# Patient Record
Sex: Male | Born: 1965 | Race: Black or African American | Hispanic: No | Marital: Single | Smoking: Never smoker
Health system: Southern US, Community
[De-identification: ages and names within clinical notes are randomized; demographics above are authoritative.]

## PROBLEM LIST (undated history)

## (undated) DIAGNOSIS — I10 Essential (primary) hypertension: Secondary | ICD-10-CM

## (undated) DIAGNOSIS — N189 Chronic kidney disease, unspecified: Secondary | ICD-10-CM

## (undated) HISTORY — DX: Chronic kidney disease, unspecified: N18.9

## (undated) HISTORY — DX: Essential (primary) hypertension: I10

---

## 2015-05-31 ENCOUNTER — Ambulatory Visit (INDEPENDENT_AMBULATORY_CARE_PROVIDER_SITE_OTHER): Payer: BC Managed Care – PPO | Admitting: Emergency Medicine

## 2015-05-31 VITALS — BP 128/80 | HR 83 | Temp 98.0°F | Resp 16 | Ht 67.0 in | Wt 247.0 lb

## 2015-05-31 DIAGNOSIS — IMO0002 Reserved for concepts with insufficient information to code with codable children: Secondary | ICD-10-CM

## 2015-05-31 DIAGNOSIS — T148 Other injury of unspecified body region: Secondary | ICD-10-CM

## 2015-05-31 MED ORDER — AMOXICILLIN-POT CLAVULANATE 875-125 MG PO TABS: 1.0000 | ORAL_TABLET | Freq: Two times a day (BID) | ORAL | Status: DC

## 2015-05-31 NOTE — Progress Notes (Signed)
Subjective:  Patient ID: Jacob Bis., male    DOB: [birth year]  Age: 49 y.o. MRN: [id number]  CC: Animal Bite   HPI Jacob Duke. presents  with a dog bite on his left mid forearm. He was walking his dog last night and another dog attacked him. The other dog is not current on rabies and is been confined by animal control. He is current on tetanus. He has some erythema and tenderness of the wound with some swelling. There is no drainage. There is no lymphangitis  History Jacob Duke has a past medical history of Hypertension.   He has no past surgical history on file.   His  family history is not on file.  He   reports that he has never smoked. He does not have any smokeless tobacco history on file. He reports that he does not drink alcohol. His drug history is not on file.  No outpatient prescriptions prior to visit.   No facility-administered medications prior to visit.    Social History   Social History  . Marital Status: Single    Spouse Name: N/A  . Number of Children: N/A  . Years of Education: N/A   Social History Main Topics  . Smoking status: Never Smoker   . Smokeless tobacco: None  . Alcohol Use: No  . Drug Use: None  . Sexual Activity: Not Asked   Other Topics Concern  . None   Social History Narrative  . None     Review of Systems  Constitutional: Negative for fever, chills and appetite change.  HENT: Negative for congestion, ear pain, postnasal drip, sinus pressure and sore throat.   Eyes: Negative for pain and redness.  Respiratory: Negative for cough, shortness of breath and wheezing.   Cardiovascular: Negative for leg swelling.  Gastrointestinal: Negative for nausea, vomiting, abdominal pain, diarrhea, constipation and blood in stool.  Endocrine: Negative for polyuria.  Genitourinary: Negative for dysuria, urgency, frequency and flank pain.  Musculoskeletal: Negative for gait problem.  Skin: Positive for wound. Negative for rash.    Neurological: Negative for weakness and headaches.  Psychiatric/Behavioral: Negative for confusion and decreased concentration. The patient is not nervous/anxious.     Objective:  BP 128/80 mmHg  Pulse 83  Temp(Src) 98 F (36.7 C) (Oral)  Resp 16  Ht  (1.702 m)  Wt 247 lb (112.038 kg)  BMI 38.68 kg/m2  SpO2 95%  Physical Exam  Constitutional: He is oriented to person, place, and time. He appears well-developed and well-nourished. No distress.  HENT:  Head: Normocephalic and atraumatic.  Right Ear: External ear normal.  Left Ear: External ear normal.  Nose: Nose normal.  Eyes: Conjunctivae and EOM are normal. Pupils are equal, round, and reactive to light. No scleral icterus.  Neck: Normal range of motion. Neck supple. No tracheal deviation present.  Cardiovascular: Normal rate, regular rhythm and normal heart sounds.   Pulmonary/Chest: Effort normal. No respiratory distress. He has no wheezes. He has no rales.  Abdominal: He exhibits no mass. There is no tenderness. There is no rebound and no guarding.  Musculoskeletal: He exhibits no edema.  Lymphadenopathy:    He has no cervical adenopathy.  Neurological: He is alert and oriented to person, place, and time. Coordination normal.  Skin: Skin is warm and dry. No rash noted.  Psychiatric: He has a normal mood and affect. His behavior is normal.   he has a superficial laceration of the left forearm with some cellulitis. There  is no lymphangitis or drainage. Wound looks clean    Assessment & Plan:   Jacob Duke was seen today for animal bite.  Diagnoses and all orders for this visit:  Laceration  Other orders -     Discontinue: amoxicillin-clavulanate (AUGMENTIN) 875-125 MG per tablet; Take 1 tablet by mouth 2 (two) times daily. -     amoxicillin-clavulanate (AUGMENTIN) 875-125 MG per tablet; Take 1 tablet by mouth 2 (two) times daily.   I am having Jacob Duke maintain his metoprolol succinate, metoprolol succinate,  Atorvastatin Calcium (LIPITOR PO), amLODipine, enalapril, and amoxicillin-clavulanate.  Meds ordered this encounter  Medications  . metoprolol succinate (TOPROL-XL) 50 MG 24 hr tablet    Sig: Take 50 mg by mouth daily. Take with or immediately following a meal.  . metoprolol succinate (TOPROL-XL) 100 MG 24 hr tablet    Sig: Take 100 mg by mouth daily. Take with or immediately following a meal.  . Atorvastatin Calcium (LIPITOR PO)    Sig: Take by mouth.  Marland Kitchen amLODipine (NORVASC) 10 MG tablet    Sig: Take 10 mg by mouth daily.  . enalapril (VASOTEC) 20 MG tablet    Sig: Take 20 mg by mouth daily.  Marland Kitchen DISCONTD: amoxicillin-clavulanate (AUGMENTIN) 875-125 MG per tablet    Sig: Take 1 tablet by mouth 2 (two) times daily.    Dispense:  20 tablet    Refill:  0  . amoxicillin-clavulanate (AUGMENTIN) 875-125 MG per tablet    Sig: Take 1 tablet by mouth 2 (two) times daily.    Dispense:  20 tablet    Refill:  0   We discussed the concerns about rabies in the appropriate management of the dog bite and rabies emphasis  Appropriate red flag conditions were discussed with the patient as well as actions that should be taken.  Patient expressed his understanding.  Follow-up: Return if symptoms worsen or fail to improve.  Carmelina Dane, MD

## 2015-05-31 NOTE — Patient Instructions (Signed)

## 2016-02-26 ENCOUNTER — Ambulatory Visit (INDEPENDENT_AMBULATORY_CARE_PROVIDER_SITE_OTHER): Payer: BC Managed Care – PPO | Admitting: Family Medicine

## 2016-02-26 VITALS — BP 128/84 | HR 89 | Temp 98.1°F | Resp 18 | Ht 67.0 in | Wt 270.0 lb

## 2016-02-26 DIAGNOSIS — H01006 Unspecified blepharitis left eye, unspecified eyelid: Secondary | ICD-10-CM | POA: Diagnosis not present

## 2016-02-26 DIAGNOSIS — H5310 Unspecified subjective visual disturbances: Secondary | ICD-10-CM

## 2016-02-26 DIAGNOSIS — H11422 Conjunctival edema, left eye: Secondary | ICD-10-CM | POA: Diagnosis not present

## 2016-02-26 DIAGNOSIS — H5789 Other specified disorders of eye and adnexa: Secondary | ICD-10-CM

## 2016-02-26 DIAGNOSIS — H578 Other specified disorders of eye and adnexa: Secondary | ICD-10-CM

## 2016-02-26 MED ORDER — HYPROMELLOSE (GONIOSCOPIC) 2.5 % OP SOLN: 2.0000 [drp] | Freq: Four times a day (QID) | OPHTHALMIC | Status: DC

## 2016-02-26 NOTE — Patient Instructions (Addendum)
Appoint[facility] with [facility] [place] tomorrow 02/27/16 @ 1:30pm. Please bring current insurance card with you to appointment. We will fax over your note from today's visit and insurance card we have on file.     IF you received an x-ray today, you will receive an invoice from [facility]. Please contact [facility] at [phone] with questions or concerns regarding your invoice.   IF you received labwork today, you will receive an invoice from [facility]. Please contact [facility] at [phone] with questions or concerns regarding your invoice.   Our billing staff will not be able to assist you with questions regarding bills from these companies.  You will be contacted with the lab results as soon as they are available. The fastest way to get your results is to activate your My Chart account. Instructions are located on the last page of this paperwork. If you have not heard from us regarding the results in 2 weeks, please contact this office.     Scleritis and Episcleritis The outer part of the eyeball is covered with a tough fibrous covering called the sclera. It is the white part of the eye. This tough covering also has a thin membrane lying on top of it called the episclera.   When the sclera becomes red and sore (inflamed), it is called scleritis.  When the episclera becomes inflamed, it is called episcleritis. CAUSES   Scleritis is usually more severe and is associated with autoimmune diseases such as:  Rheumatoid arthritis.  Inflammations of the bowel such as Crohn's Disease (regional enteritis).  Ulcerative colitis.  Episcleritis usually has no known cause. SYMPTOMS  Both scleritis and episcleritis cause red patches or a nodule on the eye. DIAGNOSIS  This condition should be examined by an ophthalmologist. This is because very strong medications that have side effects to the body and eye may be required to treat severe  attacks. Further investigations into the patient's general health may be necessary. TREATMENT   Episcleritis tends to get better without treatment within a week or two.  Scleritis is more severe. Often, your caregiver will prescribe steroids by mouth (orally) or as drops in the eye. This treatment helps lessen the redness and soreness (inflammation). HOME CARE INSTRUCTIONS   Take all medications as directed.  Keep your follow-up appointments as directed.  Avoid irritation of the involved eye(s).  Stop using hard or soft contact lenses until your caregiver tells you that it is safe to use them. SEEK MEDICAL CARE IF:   Redness or irritation gets worse.  You develop pain or sensitivity to light.  You develop any change in vision in the involved eye(s).   This information is not intended to replace advice given to you by your health care provider. Make sure you discuss any questions you have with your health care provider.   Document Released: 10/01/2001 Document Revised: 12/30/2011 Document Reviewed: 02/02/2009 Elsevier Interactive Patient Education [employer]2016 [employer]vier Inc.

## 2016-02-26 NOTE — Progress Notes (Signed)
Subjective:  By signing my name below, I, Stann Oresung-Kai Tsai, attest that this documentation has been prepared under the direction and in the presence of Norberto SorensonEva Shanard Treto, MD. Electronically Signed: Stann Oresung-Kai Tsai, Scribe. 02/26/2016 , 4:26 PM .  Patient was seen in Room 11 .   Patient ID: Jacob BisMorris Kahl Jr., male    DOB: 12-Feb-1966, 50 y.o.   MRN: [id number] Chief Complaint  Patient presents with  . Conjunctivitis    x 2 days/ left eye   HPI Jacob BisMorris Okuda Jr. is a 50 y.o. male who [facility] complaining of pink eye in his left eye, with watery discharge, intermittent itching and some blurry vision, that was first noticed 5 days ago. He also has slight photophobia early in the morning. He was training 5 days ago at work and noticed something possibly into his eye. He started rubbing his left eye and noticed it becoming red. He went to [facility] for a quick check up and received tobramycin eye drop. After starting the eye drops, he informs his eyes became redder. His last eye drop usage was at 2:00PM today. He denies any eye pain, fever or chills. He wears corrective lenses and denies wearing corrective contacts.   [facility] [place].  He denies chronic eye problems. He has h/o HTN.   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   Past Medical History  Diagnosis Date  . Hypertension    Prior to Admission medications   Medication Sig Start Date End Date Taking? Authorizing Provider  Atorvastatin Calcium (LIPITOR PO) Take by mouth.   Yes Historical Provider, MD  enalapril (VASOTEC) 20 MG tablet Take 20 mg by mouth daily.   Yes Historical Provider, MD  metoprolol succinate (TOPROL-XL) 100 MG 24 hr tablet Take 100 mg by mouth daily. Take with or immediately following a meal.   Yes Historical Provider, MD  metoprolol succinate (TOPROL-XL) 50 MG 24 hr tablet Take 50 mg by mouth daily. Take with or immediately following a meal.   Yes Historical Provider,  MD  amLODipine (NORVASC) 10 MG tablet Take 10 mg by mouth daily. Reported on 02/26/2016    Historical Provider, MD  amoxicillin-clavulanate (AUGMENTIN) 875-125 MG per tablet Take 1 tablet by mouth 2 (two) times daily. Patient not taking: Reported on 02/26/2016 05/31/15   Carmelina DaneJeffery S Anderson, MD   Allergies  Allergen Reactions  . Cozaar [Losartan Potassium] Hives  . Bactrim [Sulfamethoxazole-Trimethoprim] Rash    Review of Systems  Constitutional: Negative for fever, chills, activity change and fatigue.  HENT: Positive for facial swelling. Negative for congestion, rhinorrhea, sinus pressure and sore throat.   Eyes: Positive for photophobia, discharge, redness, itching and visual disturbance. Negative for pain.  Gastrointestinal: Negative for nausea, vomiting, diarrhea and constipation.  Neurological: Positive for facial asymmetry. Negative for dizziness and headaches.  Hematological: Negative for adenopathy.       Objective:   Physical Exam  Constitutional: He is oriented to person, place, and time. He appears well-developed and well-nourished. No distress.  HENT:  Head: Normocephalic and atraumatic.  Eyes: EOM are normal. Pupils are equal, round, and reactive to light.  Severe chemosis over all conjunctiva of left eye more medial than lateral injection; inflammation and swelling of the conjunctiva and inner lower lid, lower more so than upper lid Fundoscopic exam equal bilaterally, silver lined vessels, diffuse uptake, no ulcers or abrasions  Neck: Neck supple.  Cardiovascular: Normal rate.   Pulmonary/Chest: Effort normal.  No respiratory distress.  Musculoskeletal: Normal range of motion.  Neurological: He is alert and oriented to person, place, and time.  Skin: Skin is warm and dry.  Psychiatric: He has a normal mood and affect. His behavior is normal.  Nursing note and vitals reviewed.  BP 128/84 mmHg  Pulse 89  Temp(Src) 98.1 F (36.7 C) (Oral)  Resp 18  Ht  (1.702 m)   Wt 270 lb (122.471 kg)  BMI 42.28 kg/m2  SpO2 94%    Visual Acuity Screening   Right eye Left eye Both eyes  Without correction:     With correction:      Assessment & Plan:   1. Redness of eye, left   2. Blepharitis of left eye   3. Chemosis of conjunctiva, left   4. Subjective vision disturbance, left   Stat referral to opto - appt tomorrow obtained while pt was in office. Stop tobramycin gtts - could be allergic (had never been on prior ). Use artirficial tears o/n. Concern for scleritis but could be episcleritis.  Can't r/o viral conjunctivitis or keratitis.   Meds ordered this encounter  Medications  . hydroxypropyl methylcellulose / hypromellose (ISOPTO TEARS / GONIOVISC) 2.5 % ophthalmic solution    Sig: Place 2 drops into the left eye 4 (four) times daily.    Dispense:  15 mL    Refill:  0    I personally performed the services described in this documentation, which was scribed in my presence. The recorded information has been reviewed and considered, and addended by me as needed.  Norberto Sorenson, MD MPH

## 2016-02-27 NOTE — Progress Notes (Signed)
Waiting on call back with fax number - Unable to locate it on the website.  Will fax as soon as I have the number.

## 2016-08-02 ENCOUNTER — Ambulatory Visit (INDEPENDENT_AMBULATORY_CARE_PROVIDER_SITE_OTHER): Payer: BC Managed Care – PPO

## 2016-08-02 ENCOUNTER — Ambulatory Visit (INDEPENDENT_AMBULATORY_CARE_PROVIDER_SITE_OTHER): Payer: BC Managed Care – PPO | Admitting: Family Medicine

## 2016-08-02 VITALS — BP 132/100 | HR 80 | Temp 97.7°F | Resp 16 | Ht 67.0 in | Wt 271.2 lb

## 2016-08-02 DIAGNOSIS — S52691A Other fracture of lower end of right ulna, initial encounter for closed fracture: Secondary | ICD-10-CM | POA: Diagnosis not present

## 2016-08-02 DIAGNOSIS — S6991XA Unspecified injury of right wrist, hand and finger(s), initial encounter: Secondary | ICD-10-CM | POA: Diagnosis not present

## 2016-08-02 DIAGNOSIS — I10 Essential (primary) hypertension: Secondary | ICD-10-CM

## 2016-08-02 MED ORDER — IBUPROFEN 800 MG PO TABS: 800.0000 mg | ORAL_TABLET | Freq: Three times a day (TID) | ORAL | 0 refills | Status: DC | PRN

## 2016-08-02 MED ORDER — HYDROCODONE-ACETAMINOPHEN 10-325 MG PO TABS: 1.0000 | ORAL_TABLET | Freq: Three times a day (TID) | ORAL | 0 refills | Status: AC | PRN

## 2016-08-02 MED ORDER — IBUPROFEN 800 MG PO TABS: 800.0000 mg | ORAL_TABLET | Freq: Three times a day (TID) | ORAL | 0 refills | Status: AC | PRN

## 2016-08-02 NOTE — Patient Instructions (Addendum)
IF you received an x-ray today, you will receive an invoice from [facility]. Please contact [facility] at [phone] with questions or concerns regarding your invoice.   IF you received labwork today, you will receive an invoice from [employer]. Please contact [facility] at [phone] with questions or concerns regarding your invoice.   Our billing staff will not be able to assist you with questions regarding bills from these companies.  You will be contacted with the lab results as soon as they are available. The fastest way to get your results is to activate your My Chart account. Instructions are located on the last page of this paperwork. If you have not heard from [place] regarding the results in 2 weeks, please contact this office.      Forearm Fracture A forearm fracture is a break in one or both of the bones of your arm that are between the elbow and the wrist. Your forearm is made up of two bones:  Radius. This is the bone on the inside of your arm near your thumb.  Ulna. This is the bone on the outside of your arm near your little finger. Middle forearm fractures usually break both the radius and the ulna. Most forearm fractures that involve both the ulna and radius will require surgery. CAUSES Common causes of this type of fracture include:  Falling on an outstretched arm.  Accidents, such as a car or bike accident.  A hard, direct hit to the middle part of your arm. RISK FACTORS You may be at higher risk for this type of fracture if:  You play contact sports.  You have a condition that causes your bones to be weak or thin (osteoporosis). SIGNS AND SYMPTOMS A forearm fracture causes pain immediately after the injury. Other signs and symptoms include:  An abnormal bend or bump in your arm (deformity).  Swelling.  Numbness or tingling.  Tenderness.  Inability to turn your hand from side to side  (rotate).  Bruising. DIAGNOSIS Your health care provider may diagnose a forearm fracture based on:  Your symptoms.  Your medical history, including any recent injury.  A physical exam. Your health care provider will look for any deformity and feel for tenderness over the break. Your health care provider will also check whether the bones are out of place.  An X-ray exam to confirm the diagnosis and learn more about the type of fracture. TREATMENT The goals of treatment are to get the bone or bones in proper position for healing and to keep the bones from moving so they will heal over time. Your treatment will depend on many factors, especially the type of fracture that you have.  If the fractured bone or bones:  Are in the correct position (nondisplaced), you may only need to wear a cast or a splint.  Have a slightly displaced fracture, you may need to have the bones moved back into place manually (closed reduction) before the splint or cast is put on.  You may have a temporary splint before you have a cast. The splint allows room for some swelling. After a few days, a cast can replace the splint.  You may have to wear the cast for 6-8 weeks or as directed by your health care provider.  The cast may be changed after about 3 weeks or as directed by your health care provider.  After your cast is removed, you may need physical therapy to regain full movement in  your wrist or elbow.  You may need emergency surgery if you have:  A fractured bone or bones that are out of position (displaced).  A fracture with multiple fragments (comminuted fracture).  A fracture that breaks the skin (open fracture). This type of fracture may require surgical wires, plates, or screws to hold the bone or bones in place.  You may have X-rays every couple of weeks to check on your healing. HOME CARE INSTRUCTIONS If You Have a Cast:  Do not stick anything inside the cast to scratch your skin. Doing that  increases your risk of infection.  Check the skin around the cast every day. Report any concerns to your health care provider. You may put lotion on dry skin around the edges of the cast. Do not apply lotion to the skin underneath the cast. If You Have a Splint:  Wear it as directed by your health care provider. Remove it only as directed by your health care provider.  Loosen the splint if your fingers become numb and tingle, or if they turn cold and blue. Bathing  Cover the cast or splint with a watertight plastic bag to protect it from water while you bathe or shower. Do not let the cast or splint get wet. Managing Pain, Stiffness, and Swelling  If directed, apply ice to the injured area:  Put ice in a plastic bag.  Place a towel between your skin and the bag.  Leave the ice on for 20 minutes, 2-3 times a day.  Move your fingers often to avoid stiffness and to lessen swelling.  Raise the injured area above the level of your heart while you are sitting or lying down. Driving  Do not drive or operate heavy machinery while taking pain medicine.  Do not drive while wearing a cast or splint on a hand that you use for driving. Activity  Return to your normal activities as directed by your health care provider. Ask your health care provider what activities are safe for you.  Perform range-of-motion exercises only as directed by your health care provider. Safety  Do not use your injured limb to support your body weight until your health care provider says that you can. General Instructions  Do not put pressure on any part of the cast or splint until it is fully hardened. This may take several hours.  Keep the cast or splint clean and dry.  Do not use any tobacco products, including cigarettes, chewing tobacco, or electronic cigarettes. Tobacco can delay bone healing. If you need help quitting, ask your health care provider.  Take medicines only as directed by your health care  provider.  Keep all follow-up visits as directed by your health care provider. This is important. SEEK MEDICAL CARE IF:  Your pain medicine is not helping.  Your cast or splint becomes wet or damaged or suddenly feels too tight.  Your cast becomes loose.  You have more severe pain or swelling than you did before the cast.  You have severe pain when you stretch your fingers.  You continue to have pain or stiffness in your elbow or your wrist after your cast is removed. SEEK IMMEDIATE MEDICAL CARE IF:  You cannot move your fingers.  You lose feeling in your fingers or your hand.  Your hand or your fingers turn cold and pale or blue.  You notice a bad smell coming from your cast.  You have drainage from underneath your cast.  You have new stains from  blood or drainage that is coming through your cast.   This information is not intended to replace advice given to you by your health care provider. Make sure you discuss any questions you have with your health care provider.   Document Released: 10/04/2000 Document Revised: 10/28/2014 Document Reviewed: 05/23/2014 Elsevier Interactive Patient Education 2016 [employer]  Forearm Fracture A forearm fracture is a break in one or both of the bones of your arm that are between the elbow and the wrist. Your forearm is made up of two bones:  Radius. This is the bone on the inside of your arm near your thumb.  Ulna. This is the bone on the outside of your arm near your little finger. Middle forearm fractures usually break both the radius and the ulna. Most forearm fractures that involve both the ulna and radius will require surgery. CAUSES Common causes of this type of fracture include:  Falling on an outstretched arm.  Accidents, such as a car or bike accident.  A hard, direct hit to the middle part of your arm. RISK FACTORS You may be at higher risk for this type of fracture if:  You play contact sports.  You have a  condition that causes your bones to be weak or thin (osteoporosis). SIGNS AND SYMPTOMS A forearm fracture causes pain immediately after the injury. Other signs and symptoms include:  An abnormal bend or bump in your arm (deformity).  Swelling.  Numbness or tingling.  Tenderness.  Inability to turn your hand from side to side (rotate).  Bruising. DIAGNOSIS Your health care provider may diagnose a forearm fracture based on:  Your symptoms.  Your medical history, including any recent injury.  A physical exam. Your health care provider will look for any deformity and feel for tenderness over the break. Your health care provider will also check whether the bones are out of place.  An X-ray exam to confirm the diagnosis and learn more about the type of fracture. TREATMENT The goals of treatment are to get the bone or bones in proper position for healing and to keep the bones from moving so they will heal over time. Your treatment will depend on many factors, especially the type of fracture that you have.  If the fractured bone or bones:  Are in the correct position (nondisplaced), you may only need to wear a cast or a splint.  Have a slightly displaced fracture, you may need to have the bones moved back into place manually (closed reduction) before the splint or cast is put on.  You may have a temporary splint before you have a cast. The splint allows room for some swelling. After a few days, a cast can replace the splint.  You may have to wear the cast for 6-8 weeks or as directed by your health care provider.  The cast may be changed after about 3 weeks or as directed by your health care provider.  After your cast is removed, you may need physical therapy to regain full movement in your wrist or elbow.  You may need emergency surgery if you have:  A fractured bone or bones that are out of position (displaced).  A fracture with multiple fragments (comminuted fracture).  A  fracture that breaks the skin (open fracture). This type of fracture may require surgical wires, plates, or screws to hold the bone or bones in place.  You may have X-rays every couple of weeks to check on your healing. HOME CARE INSTRUCTIONS If  You Have a Cast:  Do not stick anything inside the cast to scratch your skin. Doing that increases your risk of infection.  Check the skin around the cast every day. Report any concerns to your health care provider. You may put lotion on dry skin around the edges of the cast. Do not apply lotion to the skin underneath the cast. If You Have a Splint:  Wear it as directed by your health care provider. Remove it only as directed by your health care provider.  Loosen the splint if your fingers become numb and tingle, or if they turn cold and blue. Bathing  Cover the cast or splint with a watertight plastic bag to protect it from water while you bathe or shower. Do not let the cast or splint get wet. Managing Pain, Stiffness, and Swelling  If directed, apply ice to the injured area:  Put ice in a plastic bag.  Place a towel between your skin and the bag.  Leave the ice on for 20 minutes, 2-3 times a day.  Move your fingers often to avoid stiffness and to lessen swelling.  Raise the injured area above the level of your heart while you are sitting or lying down. Driving  Do not drive or operate heavy machinery while taking pain medicine.  Do not drive while wearing a cast or splint on a hand that you use for driving. Activity  Return to your normal activities as directed by your health care provider. Ask your health care provider what activities are safe for you.  Perform range-of-motion exercises only as directed by your health care provider. Safety  Do not use your injured limb to support your body weight until your health care provider says that you can. General Instructions  Do not put pressure on any part of the cast or splint until  it is fully hardened. This may take several hours.  Keep the cast or splint clean and dry.  Do not use any tobacco products, including cigarettes, chewing tobacco, or electronic cigarettes. Tobacco can delay bone healing. If you need help quitting, ask your health care provider.  Take medicines only as directed by your health care provider.  Keep all follow-up visits as directed by your health care provider. This is important. SEEK MEDICAL CARE IF:  Your pain medicine is not helping.  Your cast or splint becomes wet or damaged or suddenly feels too tight.  Your cast becomes loose.  You have more severe pain or swelling than you did before the cast.  You have severe pain when you stretch your fingers.  You continue to have pain or stiffness in your elbow or your wrist after your cast is removed. SEEK IMMEDIATE MEDICAL CARE IF:  You cannot move your fingers.  You lose feeling in your fingers or your hand.  Your hand or your fingers turn cold and pale or blue.  You notice a bad smell coming from your cast.  You have drainage from underneath your cast.  You have new stains from blood or drainage that is coming through your cast.   This information is not intended to replace advice given to you by your health care provider. Make sure you discuss any questions you have with your health care provider.   Document Released: 10/04/2000 Document Revised: 10/28/2014 Document Reviewed: 05/23/2014 Elsevier Interactive Patient Education [employer]

## 2016-08-02 NOTE — Progress Notes (Signed)
Chief Complaint  Patient presents with  . 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  Hypertension- he has a history of hypertension His bp is high today and he took his usual meds He reports that it is the pain and he is nervous that something is wrong No chest pains or palpitations  BP Readings from Last 3 Encounters:  08/02/16 (!) 132/100  02/26/16 128/84  05/31/15 128/80     Past Medical History:  Diagnosis Date  . Hypertension     Current Outpatient Prescriptions  Medication Sig Dispense Refill  . Atorvastatin Calcium (LIPITOR PO) Take by mouth.    . enalapril (VASOTEC) 20 MG tablet Take 20 mg by mouth daily.    . metoprolol succinate (TOPROL-XL) 100 MG 24 hr tablet Take 100 mg by mouth daily. Take with or immediately following a meal.    . metoprolol succinate (TOPROL-XL) 50 MG 24 hr tablet Take 50 mg by mouth daily. Take with or immediately following a meal.    . HYDROcodone-acetaminophen (NORCO) 10-325 MG tablet Take 1 tablet by mouth every 8 (eight) hours as needed for severe pain. 15 tablet 0  . ibuprofen (ADVIL,MOTRIN) 800 MG tablet Take 1 tablet (800 mg total) by mouth every 8 (eight) hours as needed. 30 tablet 0   No current facility-administered medications for this visit.     Allergies:  Allergies  Allergen Reactions  . Cozaar [Losartan Potassium] Hives  . Bactrim [Sulfamethoxazole-Trimethoprim] Rash    History reviewed. No pertinent surgical history.  Social History   Social History  . Marital status: Single    Spouse name: N/A  . Number of children: N/A  . Years of education: N/A   Social  History Main Topics  . Smoking status: Never Smoker  . Smokeless tobacco: Never Used  . Alcohol use No  . Drug use: Unknown  . Sexual activity: Not Asked   Other Topics Concern  . None   Social History Narrative  . None    ROS  Objective: Vitals:   08/02/16 0846  BP: (!) 132/100  Pulse: 80  Resp: 16  Temp: 97.7 F (36.5 C)  TempSrc: Oral  SpO2: 96%  Weight: 271 lb 3.2 oz (123 kg)  Height: 5\' 7"  (1.702 m)    Physical Exam  Constitutional: He is oriented to person, place, and time. He appears well-developed and well-nourished.  HENT:  Head: Normocephalic and atraumatic.  Eyes: Conjunctivae and EOM are normal.  Cardiovascular: Normal rate, regular rhythm and normal heart sounds.   No murmur heard. Pulmonary/Chest: Effort normal and breath sounds normal. No respiratory distress. He has no wheezes.  Neurological: He is alert and oriented to person, place, and time.    Wrist - right Erythema and edema of the right wrist Tenderness along the ulnar side of the wrist and forearm Tenderness with range of motion Reduced grip strength Forearm tender to palpation  Elbow - right  Nontender, normal range of motion  LEFT wrist and elbow normal  XRAY CLINICAL DATA:  Pain following fall  EXAM: RIGHT WRIST -  COMPLETE 3+ VIEW  COMPARISON:  None.  FINDINGS: Frontal, oblique, lateral, and ulnar deviation scaphoid images were obtained. There is a transversely oriented fracture of the distal radial metaphysis with alignment essentially anatomic. There is avulsion of the ulnar styloid. No other fractures. No dislocations. Joint spaces appear normal. No erosive change.  IMPRESSION: Nondisplaced transversely oriented fracture distal radial metaphysis. Avulsion ulnar styloid. No dislocations. No evident arthropathy.   Electronically Signed   By: Bretta BangWilliam  Woodruff III M.D.   On: 08/02/2016 09:18    Assessment and Plan Jacob Duke was seen today for  fall.  Diagnoses and all orders for this visit:  Injury of right wrist, initial encounter -     DG Wrist Complete Right; Future -     Ambulatory referral to Orthopedic Surgery -     Splint wrist -     Discontinue: ibuprofen (ADVIL,MOTRIN) 800 MG tablet; Take 1 tablet (800 mg total) by mouth every 8 (eight) hours as needed. -     ibuprofen (ADVIL,MOTRIN) 800 MG tablet; Take 1 tablet (800 mg total) by mouth every 8 (eight) hours as needed.  Other closed fracture of distal end of right ulna, initial encounter -     Ambulatory referral to Orthopedic Surgery -     Splint wrist -     Discontinue: ibuprofen (ADVIL,MOTRIN) 800 MG tablet; Take 1 tablet (800 mg total) by mouth every 8 (eight) hours as needed. -     ibuprofen (ADVIL,MOTRIN) 800 MG tablet; Take 1 tablet (800 mg total) by mouth every 8 (eight) hours as needed. -     HYDROcodone-acetaminophen (NORCO) 10-325 MG tablet; Take 1 tablet by mouth every 8 (eight) hours as needed for severe pain.  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  Will splint today Ibuprofen for inflammation and pain norco for severe pain      Jacob Duke A Creta LevinStallings

## 2016-08-07 ENCOUNTER — Ambulatory Visit (INDEPENDENT_AMBULATORY_CARE_PROVIDER_SITE_OTHER): Payer: BC Managed Care – PPO | Admitting: Orthopedic Surgery

## 2016-08-07 DIAGNOSIS — S52591A Other fractures of lower end of right radius, initial encounter for closed fracture: Secondary | ICD-10-CM

## 2016-08-22 ENCOUNTER — Ambulatory Visit (INDEPENDENT_AMBULATORY_CARE_PROVIDER_SITE_OTHER): Payer: BC Managed Care – PPO | Admitting: Orthopedic Surgery

## 2016-08-22 ENCOUNTER — Ambulatory Visit (INDEPENDENT_AMBULATORY_CARE_PROVIDER_SITE_OTHER): Payer: BC Managed Care – PPO

## 2016-08-22 ENCOUNTER — Encounter (INDEPENDENT_AMBULATORY_CARE_PROVIDER_SITE_OTHER): Payer: Self-pay | Admitting: Orthopedic Surgery

## 2016-08-22 DIAGNOSIS — S52591D Other fractures of lower end of right radius, subsequent encounter for closed fracture with routine healing: Secondary | ICD-10-CM

## 2016-08-22 NOTE — Progress Notes (Addendum)
   Office Visit Note   Patient: Jacob BisMorris Jaime Jr.           Date of Birth: 09-20-1966           MRN: [id number] Visit Date: 08/22/2016 Requested by: Alan MulderLouis Chetty, MD [address] [place], [place] [id number] PCP: Alan MulderHETTY, LOUIS, MD  Subjective: Chief Complaint  Patient presents with  . Right Wrist - Follow-up, Fracture   50 year old right-hand patient to half weeks out right distal radius fracture no symptoms Patient returns today s/p fall with right distal radius fracture sustained on  08/02/16.  He is in a removeable wrist splint.  Colder weather causes increased pain.  He is not taking any meds for this.                  Review of Systems   Assessment & Plan: Visit Diagnoses: No diagnosis found.  Plan: Examination today demonstrates no real tenderness around the fracture site.  I did taken to fluoroscopy to get a better true lateral and does show about 10 of apex dorsal angulation of the joint surface.  This is a change from his initial radiographs.  Plan at this time is to keep him in the splint full-time recheck in 10 days to see if it settles anymore if it does and he may need to have fixation possible osteotomy.  We'll check him back in 10 days for clinical recheck and repeat radiographs possible fluoroscopy  Follow-Up Instructions: No Follow-up on file.   Orders:  No orders of the defined types were placed in this encounter.  No orders of the defined types were placed in this encounter.     Procedures: No procedures performed   Clinical Data: No additional findings.    Objective: Vital Signs: There were no vitals taken for this visit.  Physical Exam  Ortho Exam  Specialty Comments:  No specialty comments available.  Imaging: No results found.   PMFS History: There are no active problems to display for this patient.  Past Medical History:  Diagnosis Date  . Hypertension     No family history on file.  No past surgical history on  file. Social History   Occupational History  . Not on file.   Social History Main Topics  . Smoking status: Never Smoker  . Smokeless tobacco: Never Used  . Alcohol use No  . Drug use: Unknown  . Sexual activity: Not on file

## 2016-09-04 ENCOUNTER — Encounter (INDEPENDENT_AMBULATORY_CARE_PROVIDER_SITE_OTHER): Payer: Self-pay | Admitting: Orthopedic Surgery

## 2016-09-04 ENCOUNTER — Ambulatory Visit (INDEPENDENT_AMBULATORY_CARE_PROVIDER_SITE_OTHER): Payer: BC Managed Care – PPO | Admitting: Orthopedic Surgery

## 2016-09-04 DIAGNOSIS — S52501D Unspecified fracture of the lower end of right radius, subsequent encounter for closed fracture with routine healing: Secondary | ICD-10-CM

## 2016-09-04 NOTE — Progress Notes (Signed)
   Post-Op Visit Note   Patient: Jacob BisMorris Sangiovanni Jr.           Date of Birth: Jan 20, 1966           MRN: [id number] Visit Date: 09/04/2016 PCP: Alan MulderHETTY, LOUIS, MD   Assessment & Plan:  Chief Complaint:  Chief Complaint  Patient presents with  . Right Wrist - Follow-up, Fracture   Visit Diagnoses:  1. Closed fracture of distal end of right radius with routine healing, unspecified fracture morphology, subsequent encounter     Plan: Jacob Duke is a 50 year old patient with right distal radius fracture.  He comes in today for repeat evaluation.  Fluoroscopic evaluation demonstrates no change in fracture alignment compared to 2 days ago.  He has about 10 of apex dorsal angulation.  This is not changed on an acute him in that splint for 2 more weeks and let him come out for 2 weeks to regular activity range of motion exercises can back in 4 weeks for final clinical check  Follow-Up Instructions: Return in about 4 weeks (around 10/02/2016).   Orders:  No orders of the defined types were placed in this encounter.  No orders of the defined types were placed in this encounter.    PMFS History: There are no active problems to display for this patient.  Past Medical History:  Diagnosis Date  . Hypertension     No family history on file.  No past surgical history on file. Social History   Occupational History  . Not on file.   Social History Main Topics  . Smoking status: Never Smoker  . Smokeless tobacco: Never Used  . Alcohol use No  . Drug use: Unknown  . Sexual activity: Not on file

## 2016-10-02 ENCOUNTER — Ambulatory Visit (INDEPENDENT_AMBULATORY_CARE_PROVIDER_SITE_OTHER): Payer: BC Managed Care – PPO | Admitting: Orthopedic Surgery

## 2016-10-02 ENCOUNTER — Encounter (INDEPENDENT_AMBULATORY_CARE_PROVIDER_SITE_OTHER): Payer: Self-pay | Admitting: Orthopedic Surgery

## 2016-10-02 DIAGNOSIS — S52591D Other fractures of lower end of right radius, subsequent encounter for closed fracture with routine healing: Secondary | ICD-10-CM

## 2016-10-02 DIAGNOSIS — S52501D Unspecified fracture of the lower end of right radius, subsequent encounter for closed fracture with routine healing: Secondary | ICD-10-CM | POA: Insufficient documentation

## 2016-10-04 NOTE — Progress Notes (Signed)
   Post-Op Visit Note   Patient: Jacob BisMorris Lachapelle Jr.           Date of Birth: December 24, 1965           MRN: [id number] Visit Date: 10/02/2016 PCP: Alan MulderHETTY, LOUIS, MD   Assessment & Plan:  Chief Complaint:  Chief Complaint  Patient presents with  . Right Wrist - Follow-up, Fracture   Visit Diagnoses:  1. Other closed fracture of distal end of right radius with routine healing, subsequent encounter     Plan: Jacob Duke is a 50 year old patient right wrist pain follow-up distal radius fracture.  He's now about 2 months out.  He's been doing well.  On exam he has excellent range of motion and no real tenderness around the fracture site.  A Mentor release him at this time.  He's a good recovery especially with wrist flexion considering the slight dorsal angulation of the joint surface.  I'll see him back as needed  Follow-Up Instructions: Return if symptoms worsen or fail to improve.   Orders:  No orders of the defined types were placed in this encounter.  No orders of the defined types were placed in this encounter.    PMFS History: Patient Active Problem List   Diagnosis Date Noted  . Closed fracture of lower end of right radius with routine healing 10/02/2016   Past Medical History:  Diagnosis Date  . Hypertension     No family history on file.  No past surgical history on file. Social History   Occupational History  . Not on file.   Social History Main Topics  . Smoking status: Never Smoker  . Smokeless tobacco: Never Used  . Alcohol use No  . Drug use: Unknown  . Sexual activity: Not on file

## 2017-03-12 ENCOUNTER — Ambulatory Visit (INDEPENDENT_AMBULATORY_CARE_PROVIDER_SITE_OTHER): Payer: Worker's Compensation | Admitting: Emergency Medicine

## 2017-03-12 ENCOUNTER — Encounter: Payer: Self-pay | Admitting: Emergency Medicine

## 2017-03-12 ENCOUNTER — Ambulatory Visit: Payer: BC Managed Care – PPO | Admitting: Family Medicine

## 2017-03-12 VITALS — BP 127/77 | HR 65 | Temp 98.0°F | Resp 18 | Ht 66.42 in | Wt 201.0 lb

## 2017-03-12 DIAGNOSIS — S39012A Strain of muscle, fascia and tendon of lower back, initial encounter: Secondary | ICD-10-CM | POA: Diagnosis not present

## 2017-03-12 MED ORDER — CYCLOBENZAPRINE HCL 10 MG PO TABS: 10.0000 mg | ORAL_TABLET | Freq: Three times a day (TID) | ORAL | 0 refills | Status: AC | PRN

## 2017-03-12 MED ORDER — TRAMADOL HCL 50 MG PO TABS: 50.0000 mg | ORAL_TABLET | Freq: Three times a day (TID) | ORAL | 0 refills | Status: AC | PRN

## 2017-03-12 NOTE — Progress Notes (Signed)
Jacob Beverley FiedlerBrooks Jr. 51 y.o.   Chief Complaint  Patient presents with  . Back Pain    X 1 day lower back - W/C    HISTORY OF PRESENT ILLNESS: This is a 51 y.o. male complaining of low back pain following injury at work yesterday.  Back Pain  This is a new problem. The current episode started yesterday. The problem occurs constantly. The problem has been gradually worsening since onset. The pain is present in the lumbar spine. The quality of the pain is described as aching. The pain does not radiate. The pain is at a severity of 5/10. The pain is moderate. The pain is the same all the time. The symptoms are aggravated by bending, position and twisting. Pertinent negatives include no abdominal pain, bladder incontinence, bowel incontinence, chest pain, dysuria, fever, leg pain, numbness, paresis, paresthesias, pelvic pain, perianal numbness, tingling or weakness. Risk factors: none.     Prior to Admission medications   Medication Sig Start Date End Date Taking? Authorizing Provider  Atorvastatin Calcium (LIPITOR PO) Take by mouth.   Yes [provider]  enalapril (VASOTEC) 20 MG tablet Take 20 mg by mouth daily.   Yes [provider]  hydrochlorothiazide (HYDRODIURIL) 25 MG tablet Take 25 mg by mouth daily.   Yes [provider]  metoprolol succinate (TOPROL-XL) 100 MG 24 hr tablet Take 100 mg by mouth daily. Take with or immediately following a meal.   Yes [provider]  metoprolol succinate (TOPROL-XL) 50 MG 24 hr tablet Take 50 mg by mouth daily. Take with or immediately following a meal.   Yes [provider]  Omega-3 Fatty Acids (FISH OIL) 1000 MG CAPS Take 1,000 capsules by mouth.   Yes [provider]  HYDROcodone-acetaminophen (NORCO) 10-325 MG tablet Take 1 tablet by mouth every 8 (eight) hours as needed for severe pain. Patient not taking: Reported on 10/02/2016 08/02/16   Doristine BosworthStallings, Zoe A, MD  ibuprofen (ADVIL,MOTRIN) 800 MG  tablet Take 1 tablet (800 mg total) by mouth every 8 (eight) hours as needed. Patient not taking: Reported on 10/02/2016 08/02/16   Doristine BosworthStallings, Zoe A, MD    Allergies  Allergen Reactions  . Cozaar [Losartan Potassium] Hives  . Bactrim [Sulfamethoxazole-Trimethoprim] Rash    Patient Active Problem List   Diagnosis Date Noted  . Closed fracture of lower end of right radius with routine healing 10/02/2016    Past Medical History:  Diagnosis Date  . Hypertension     History reviewed. No pertinent surgical history.  Social History   Social History  . Marital status: Single    Spouse name: N/A  . Number of children: N/A  . Years of education: N/A   Occupational History  . Not on file.   Social History Main Topics  . Smoking status: Never Smoker  . Smokeless tobacco: Never Used  . Alcohol use No  . Drug use: No  . Sexual activity: Not on file   Other Topics Concern  . Not on file   Social History Narrative  . No narrative on file    Family History  Problem Relation Age of Onset  . Diabetes Mother   . Hypertension Father   . Hyperlipidemia Father      Review of Systems  Constitutional: Negative.  Negative for fever.  HENT: Negative.   Eyes: Negative.   Respiratory: Negative for cough and shortness of breath.   Cardiovascular: Negative.  Negative for chest pain.  Gastrointestinal: Negative for abdominal  pain, bowel incontinence, diarrhea, nausea and vomiting.  Genitourinary: Negative for bladder incontinence, dysuria, flank pain and pelvic pain.  Musculoskeletal: Positive for back pain.  Skin: Negative.  Negative for rash.  Neurological: Negative for dizziness, tingling, weakness, numbness and paresthesias.  Endo/Heme/Allergies: Negative.   All other systems reviewed and are negative.  Vitals:   03/12/17 1135  BP: 127/77  Pulse: 65  Resp: 18  Temp: 98 F (36.7 C)     Physical Exam  Constitutional: He is oriented to person, place, and time. He  appears well-developed and well-nourished.  HENT:  Head: Normocephalic and atraumatic.  Eyes: EOM are normal. Pupils are equal, round, and reactive to light.  Neck: Normal range of motion. Neck supple.  Cardiovascular: Normal rate and regular rhythm.   Pulmonary/Chest: Effort normal and breath sounds normal.  Abdominal: Soft. Bowel sounds are normal. He exhibits no distension. There is no tenderness.  Musculoskeletal:       Lumbar back: He exhibits decreased range of motion, tenderness, pain and spasm. He exhibits no bony tenderness and normal pulse.       Back:  Neurological: He is alert and oriented to person, place, and time. He displays normal reflexes. No sensory deficit. He exhibits normal muscle tone.  Skin: Skin is warm and dry. Capillary refill takes less than 2 seconds.  Psychiatric: He has a normal mood and affect. His behavior is normal.  Vitals reviewed.    ASSESSMENT & PLAN: Jacob Duke was seen today for back pain.  Diagnoses and all orders for this visit:  Acute myofascial strain of lumbar region, initial encounter  Other orders -     traMADol (ULTRAM) 50 MG tablet; Take 1 tablet (50 mg total) by mouth every 8 (eight) hours as needed. -     cyclobenzaprine (FLEXERIL) 10 MG tablet; Take 1 tablet (10 mg total) by mouth 3 (three) times daily as needed for muscle spasms.  Has h/o kidney insufficiency so NSAID's are not recommended.  Patient Instructions       IF you received an x-ray today, you will receive an invoice from [facility]. Please contact [facility] at [phone] with questions or concerns regarding your invoice.   IF you received labwork today, you will receive an invoice from [facility]. Please contact [facility] at [phone] with questions or concerns regarding your invoice.   Our billing staff will not be able to assist you with questions regarding bills from these companies.  You will be contacted with the lab results as soon as  they are available. The fastest way to get your results is to activate your My Chart account. Instructions are located on the last page of this paperwork. If you have not heard from [place] regarding the results in 2 weeks, please contact this office.      Back Pain, Adult Back pain is very common. The pain often gets better over time. The cause of back pain is usually not dangerous. Most people can learn to manage their back pain on their own. Follow these instructions at home: Watch your back pain for any changes. The following actions may help to lessen any pain you are feeling:  Stay active. Start with short walks on flat ground if you can. Try to walk farther each day.  Exercise regularly as told by your doctor. Exercise helps your back heal faster. It also helps avoid future injury by keeping your muscles strong and flexible.  Do not sit, drive, or stand in one place for more  than 30 minutes.  Do not stay in bed. Resting more than 1-2 days can slow down your recovery.  Be careful when you bend or lift an object. Use good form when lifting:  Bend at your knees.  Keep the object close to your body.  Do not twist.  Sleep on a firm mattress. Lie on your side, and bend your knees. If you lie on your back, put a pillow under your knees.  Take medicines only as told by your doctor.  Put ice on the injured area.  Put ice in a plastic bag.  Place a towel between your skin and the bag.  Leave the ice on for 20 minutes, 2-3 times a day for the first 2-3 days. After that, you can switch between ice and heat packs.  Avoid feeling anxious or stressed. Find good ways to deal with stress, such as exercise.  Maintain a healthy weight. Extra weight puts stress on your back. Contact a doctor if:  You have pain that does not go away with rest or medicine.  You have worsening pain that goes down into your legs or buttocks.  You have pain that does not get better in one week.  You have  pain at night.  You lose weight.  You have a fever or chills. Get help right away if:  You cannot control when you poop (bowel movement) or pee (urinate).  Your arms or legs feel weak.  Your arms or legs lose feeling (numbness).  You feel sick to your stomach (nauseous) or throw up (vomit).  You have belly (abdominal) pain.  You feel like you may pass out (faint). This information is not intended to replace advice given to you by your health care provider. Make sure you discuss any questions you have with your health care provider. Document Released: 03/25/2008 Document Revised: 03/14/2016 Document Reviewed: 02/08/2014 Elsevier Interactive Patient Education  2017 [employer].      Edwina Barth, MD [facility]

## 2017-03-12 NOTE — Patient Instructions (Addendum)
     IF you received an x-ray today, you will receive an invoice from [facility]. Please contact [facility] at [phone] with questions or concerns regarding your invoice.   IF you received labwork today, you will receive an invoice from [facility]. Please contact [facility] at [phone] with questions or concerns regarding your invoice.   Our billing staff will not be able to assist you with questions regarding bills from these companies.  You will be contacted with the lab results as soon as they are available. The fastest way to get your results is to activate your My Chart account. Instructions are located on the last page of this paperwork. If you have not heard from us regarding the results in 2 weeks, please contact this office.      Back Pain, Adult Back pain is very common. The pain often gets better over time. The cause of back pain is usually not dangerous. Most people can learn to manage their back pain on their own. Follow these instructions at home: Watch your back pain for any changes. The following actions may help to lessen any pain you are feeling:  Stay active. Start with short walks on flat ground if you can. Try to walk farther each day.  Exercise regularly as told by your doctor. Exercise helps your back heal faster. It also helps avoid future injury by keeping your muscles strong and flexible.  Do not sit, drive, or stand in one place for more than 30 minutes.  Do not stay in bed. Resting more than 1-2 days can slow down your recovery.  Be careful when you bend or lift an object. Use good form when lifting:  Bend at your knees.  Keep the object close to your body.  Do not twist.  Sleep on a firm mattress. Lie on your side, and bend your knees. If you lie on your back, put a pillow under your knees.  Take medicines only as told by your doctor.  Put ice on the injured area.  Put ice in a plastic bag.  Place a towel between your  skin and the bag.  Leave the ice on for 20 minutes, 2-3 times a day for the first 2-3 days. After that, you can switch between ice and heat packs.  Avoid feeling anxious or stressed. Find good ways to deal with stress, such as exercise.  Maintain a healthy weight. Extra weight puts stress on your back. Contact a doctor if:  You have pain that does not go away with rest or medicine.  You have worsening pain that goes down into your legs or buttocks.  You have pain that does not get better in one week.  You have pain at night.  You lose weight.  You have a fever or chills. Get help right away if:  You cannot control when you poop (bowel movement) or pee (urinate).  Your arms or legs feel weak.  Your arms or legs lose feeling (numbness).  You feel sick to your stomach (nauseous) or throw up (vomit).  You have belly (abdominal) pain.  You feel like you may pass out (faint). This information is not intended to replace advice given to you by your health care provider. Make sure you discuss any questions you have with your health care provider. Document Released: 03/25/2008 Document Revised: 03/14/2016 Document Reviewed: 02/08/2014 Elsevier Interactive Patient Education  2017 Elsevier Inc.  

## 2017-03-19 ENCOUNTER — Other Ambulatory Visit: Payer: Self-pay | Admitting: Emergency Medicine

## 2018-02-16 IMAGING — DX DG WRIST COMPLETE 3+V*R*
4 series · 4 of 4 positions shown · non-contrast
Comparison: None.

CLINICAL DATA: Pain following fall

EXAM:
RIGHT WRIST - COMPLETE 3+ VIEW

[wrist pa]
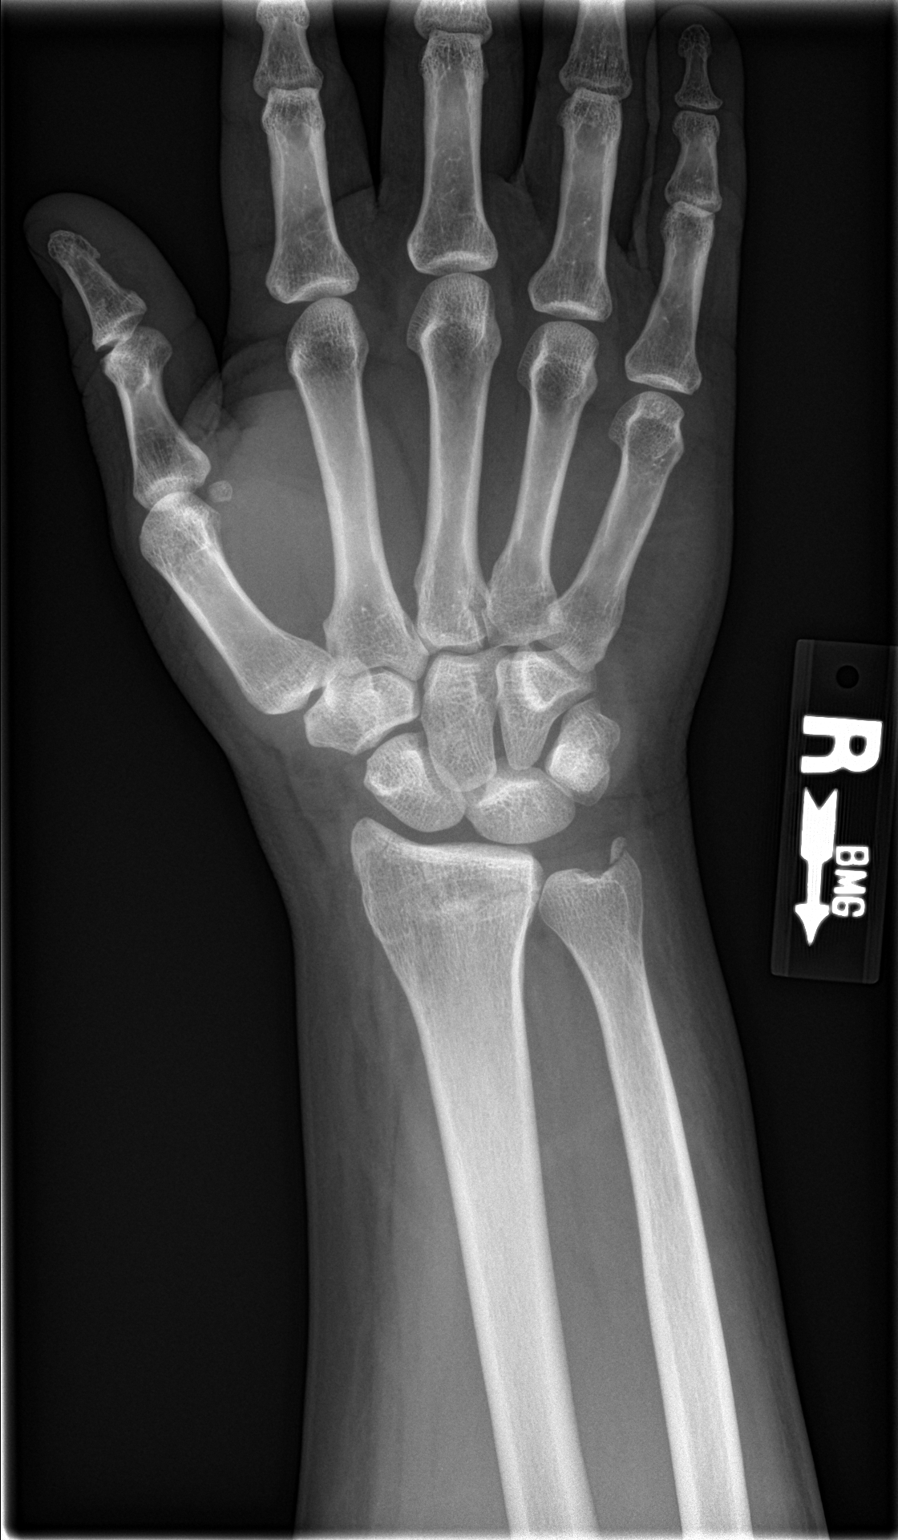

[wrist obl]
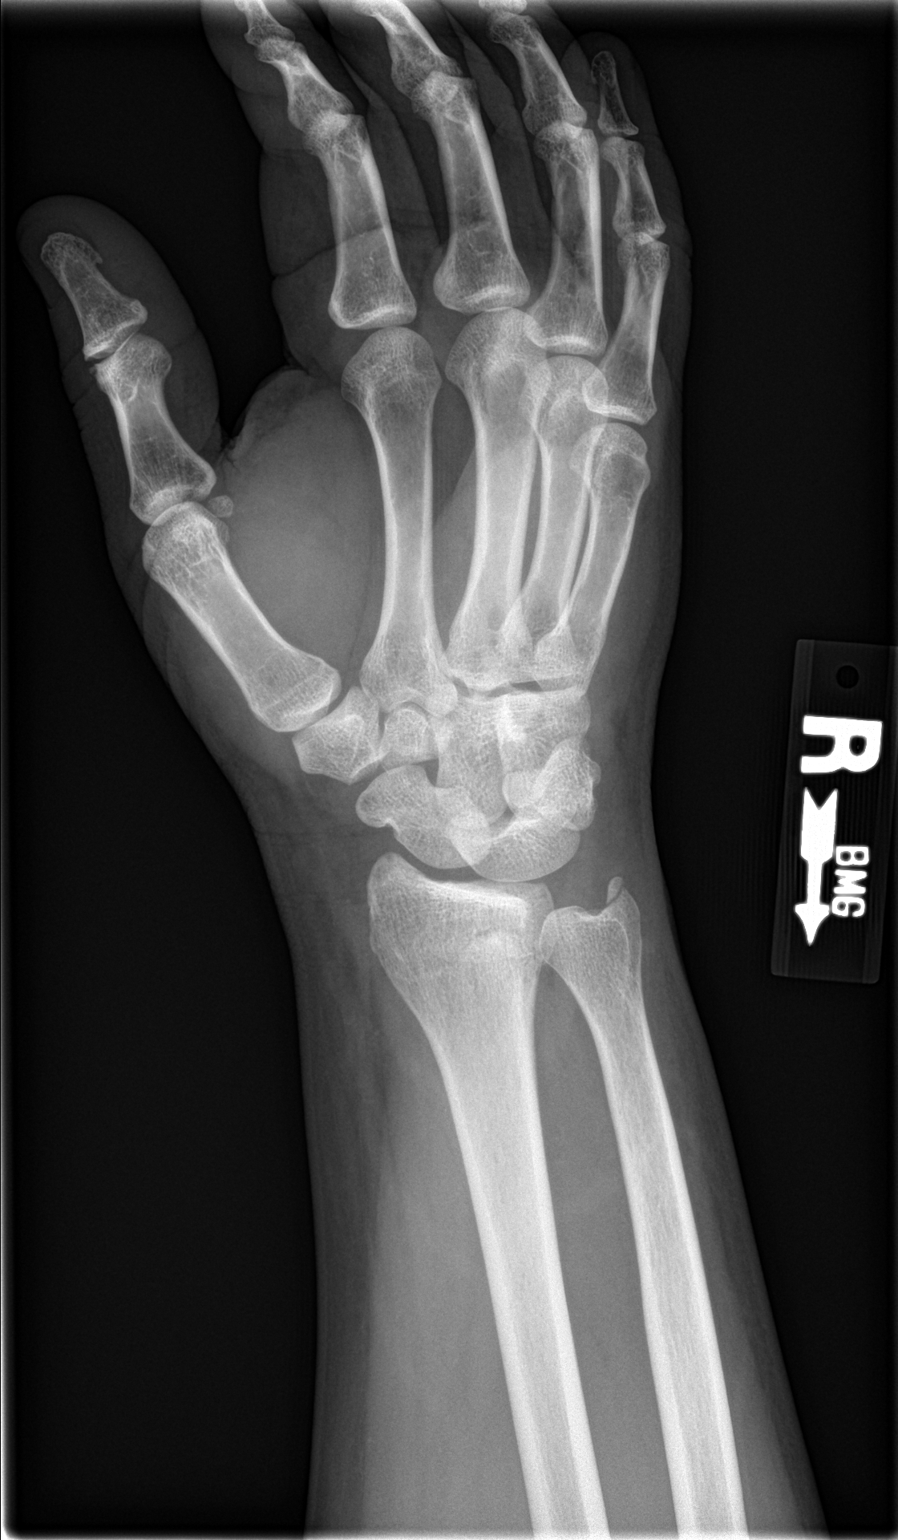

[wrist lat]
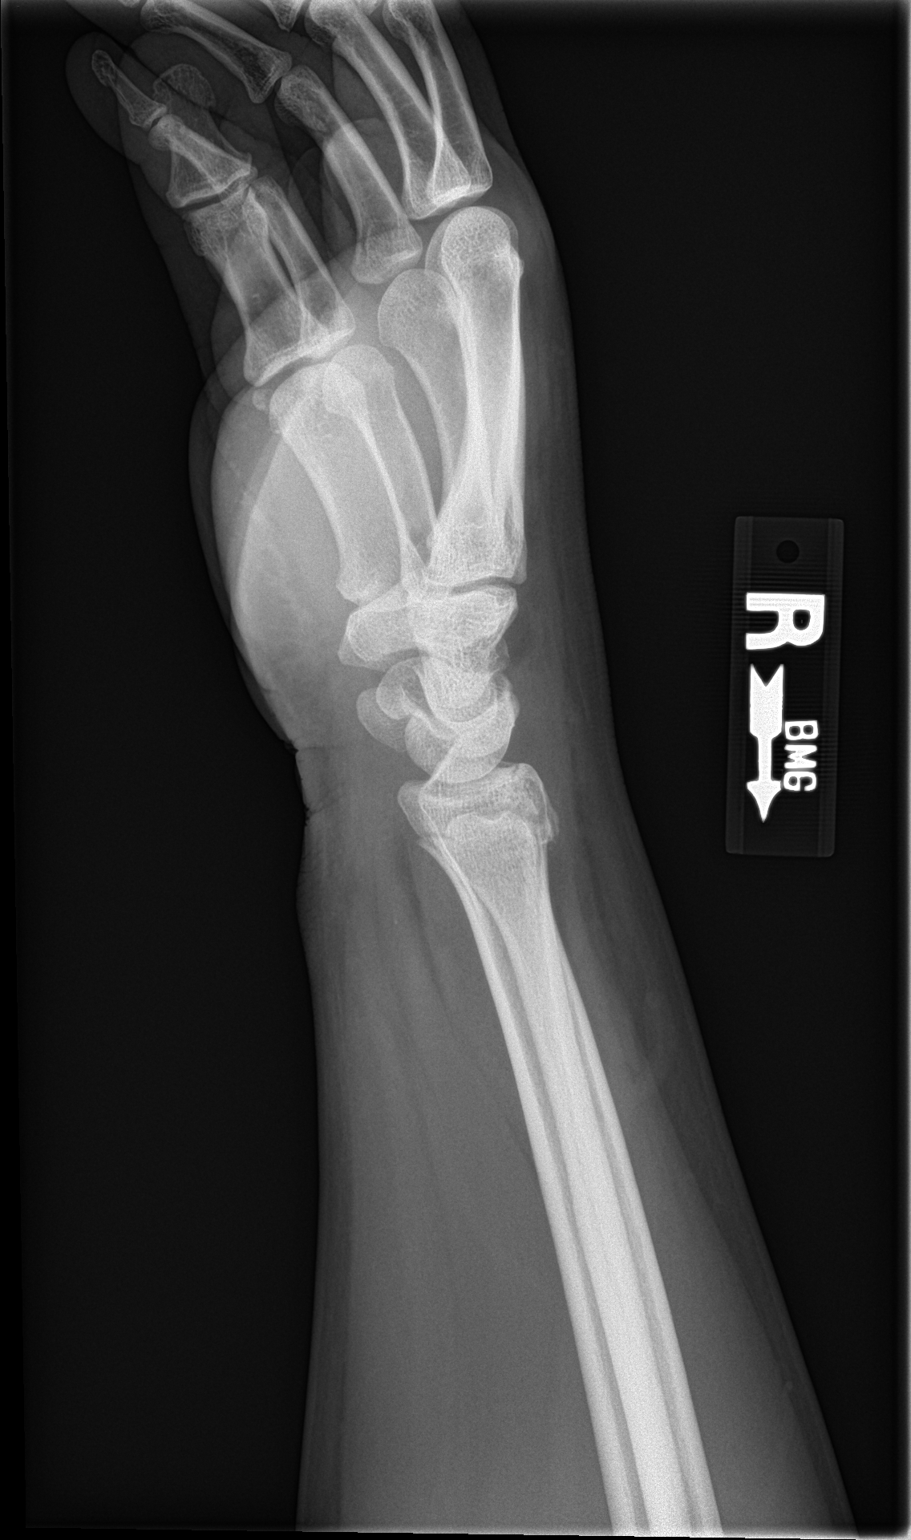

[wrist navicular]
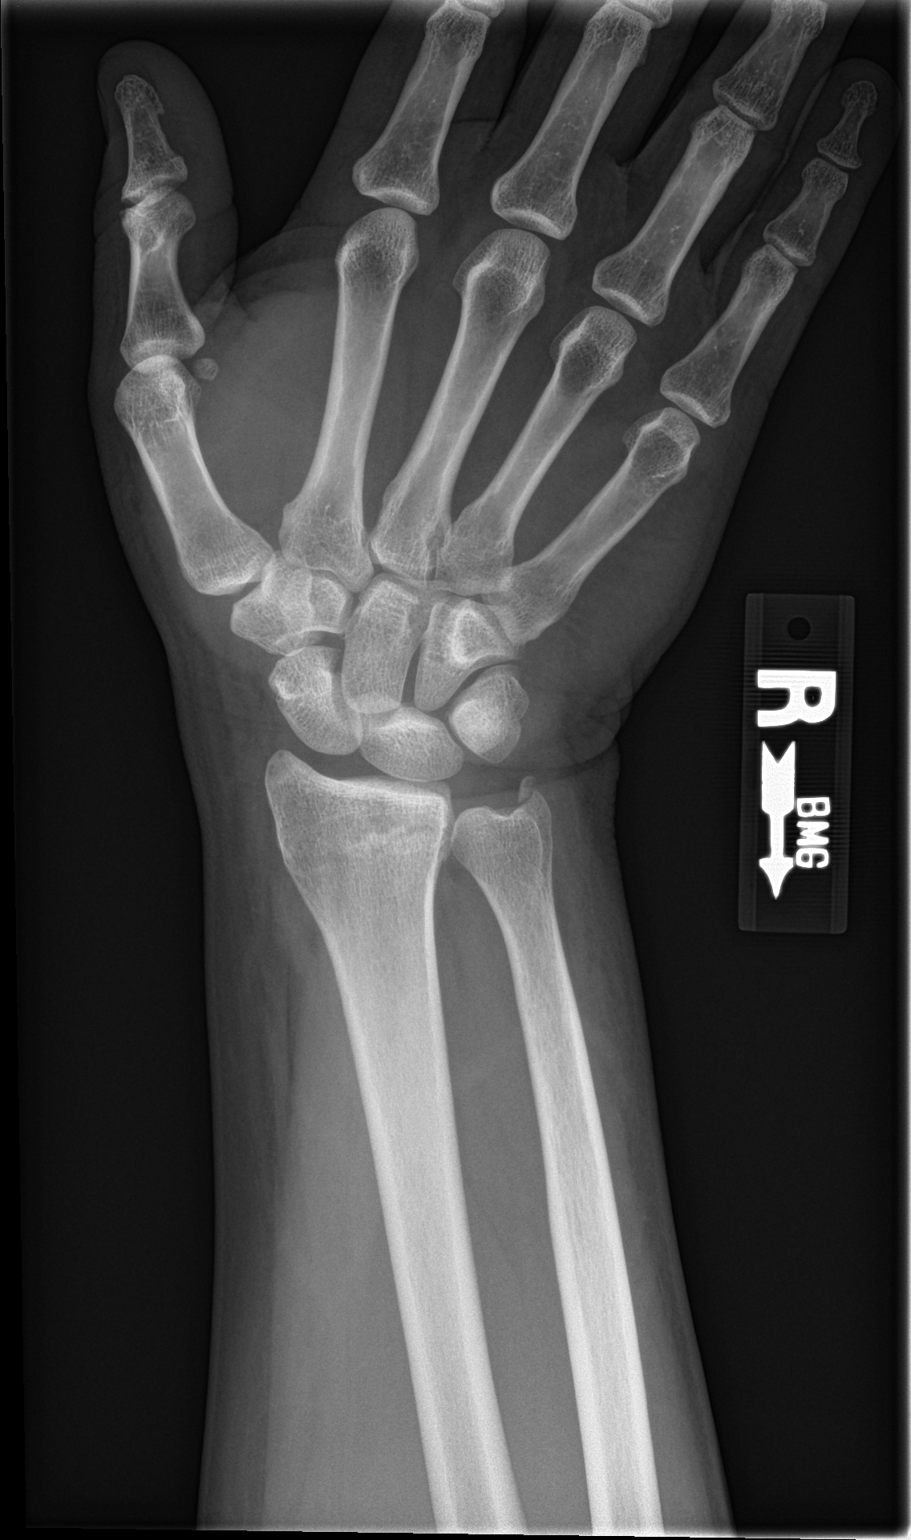

[4 of 4 positions shown; findings below may reference images not displayed]

FINDINGS: Frontal, oblique, lateral, and ulnar deviation scaphoid images were
obtained. There is a transversely oriented fracture of the distal
radial metaphysis with alignment essentially anatomic. There is
avulsion of the ulnar styloid. No other fractures. No dislocations.
Joint spaces appear normal. No erosive change.
IMPRESSION: Nondisplaced transversely oriented fracture distal radial
metaphysis. Avulsion ulnar styloid. No dislocations. No evident
arthropathy.
